# Patient Record
Sex: Male | Born: 1970 | Smoking: Never smoker
Health system: Southern US, Community
[De-identification: ages and names within clinical notes are randomized; demographics above are authoritative.]

---

## 2016-05-29 ENCOUNTER — Ambulatory Visit (INDEPENDENT_AMBULATORY_CARE_PROVIDER_SITE_OTHER): Payer: BLUE CROSS/BLUE SHIELD | Admitting: Emergency Medicine

## 2016-05-29 ENCOUNTER — Ambulatory Visit (INDEPENDENT_AMBULATORY_CARE_PROVIDER_SITE_OTHER): Payer: BLUE CROSS/BLUE SHIELD

## 2016-05-29 ENCOUNTER — Encounter: Payer: Self-pay | Admitting: Emergency Medicine

## 2016-05-29 VITALS — BP 162/82 | HR 67 | Temp 97.4°F

## 2016-05-29 DIAGNOSIS — R0789 Other chest pain: Secondary | ICD-10-CM | POA: Diagnosis not present

## 2016-05-29 DIAGNOSIS — D5701 Hb-SS disease with acute chest syndrome: Secondary | ICD-10-CM | POA: Diagnosis not present

## 2016-05-29 DIAGNOSIS — M25522 Pain in left elbow: Secondary | ICD-10-CM | POA: Diagnosis not present

## 2016-05-29 NOTE — Progress Notes (Signed)
Andrew Alexander 47 y.o.   Chief Complaint  Patient presents with  . Chest Pain    HISTORY OF PRESENT ILLNESS: This is a 46 y.o. male complaining of left elbow, left knee, chest pain since early this am. Interpreter used. No CAD risk factors; no associated symptoms.  Chest Pain   This is a new problem. The current episode started today. The onset quality is gradual. The problem occurs intermittently. The problem has been unchanged. Pain location: across chest both sides. The pain is at a severity of 3/10. The pain is mild. The quality of the pain is described as sharp. The pain does not radiate. Pertinent negatives include no abdominal pain, back pain, cough, diaphoresis, dizziness, exertional chest pressure, fever, hemoptysis, nausea, palpitations, shortness of breath, syncope or vomiting. The pain is aggravated by nothing. He has tried nothing for the symptoms. There are no known risk factors. Past medical history comments: Unremarkable     Prior to Admission medications   Not on File    Allergies not on file  Patient Active Problem List   Diagnosis Date Noted  . Left elbow pain 05/29/2016    History reviewed. No pertinent past medical history.  History reviewed. No pertinent surgical history.  Social History   Social History  . Marital status: Unknown    Spouse name: N/A  . Number of children: N/A  . Years of education: N/A   Occupational History  . Not on file.   Social History Main Topics  . Smoking status: Never Smoker  . Smokeless tobacco: Never Used  . Alcohol use No  . Drug use: No  . Sexual activity: Not on file   Other Topics Concern  . Not on file   Social History Narrative  . No narrative on file    History reviewed. No pertinent family history.   Review of Systems  Constitutional: Negative.  Negative for diaphoresis and fever.  HENT: Negative.  Negative for sore throat.   Eyes: Negative.   Respiratory: Negative.  Negative for cough, hemoptysis and  shortness of breath.   Cardiovascular: Positive for chest pain. Negative for palpitations, leg swelling and syncope.  Gastrointestinal: Negative.  Negative for abdominal pain, diarrhea, nausea and vomiting.  Genitourinary: Negative.   Musculoskeletal: Positive for joint pain (left elbow and left knee). Negative for back pain.  Skin: Negative.  Negative for rash.  Neurological: Negative for dizziness.  All other systems reviewed and are negative.  Vitals:   05/29/16 0822  BP: (!) 162/82  Pulse: 67  Temp: 97.4 F (36.3 C)    EKG: NSR with incomplete RBBB; no acute ischemic changes. Physical Exam  Constitutional: He is oriented to person, place, and time. He appears well-developed and well-nourished.  HENT:  Head: Normocephalic and atraumatic.  Mouth/Throat: Oropharynx is clear and moist. No oropharyngeal exudate.  Eyes: Conjunctivae and EOM are normal. Pupils are equal, round, and reactive to light.  Neck: Normal range of motion. Neck supple. No JVD present.  Cardiovascular: Normal rate, regular rhythm and intact distal pulses.   Pulmonary/Chest: Effort normal and breath sounds normal.  Abdominal: Soft. There is no tenderness.  Musculoskeletal: Normal range of motion.  LUE: elbow: mild tenderness lateral epicondyle; FROM, no swelling; NVI LLE: FROM, WNL; left knee: no swelling, erythema, bruising or tenderness.  Lymphadenopathy:    He has no cervical adenopathy.  Neurological: He is alert and oriented to person, place, and time.  Skin: Skin is warm and dry. Capillary refill takes less than  2 seconds.  Psychiatric: He has a normal mood and affect. His behavior is normal.  Vitals reviewed. CXR: NAD  ASSESSMENT & PLAN: Andrew Alexander was seen today for chest pain.  Diagnoses and all orders for this visit:  Atypical chest pain  Acute chest syndrome (HCC) -     EKG 12-Lead -     Comprehensive metabolic panel -     CBC with Differential/Platelet -     DG Chest 2 View; Future -      Ambulatory referral to Cardiology  Left elbow pain     Patient Instructions  Nonspecific Chest Pain Chest pain can be caused by many different conditions. There is a chance that your pain could be related to something serious, such as a heart attack or a blood clot in your lungs. Chest pain can also be caused by conditions that are not life-threatening. If you have chest pain, it is very important to follow up with your doctor. Follow these instructions at home: Medicines   If you were prescribed an antibiotic medicine, take it as told by your doctor. Do not stop taking the antibiotic even if you start to feel better.  Take over-the-counter and prescription medicines only as told by your doctor. Lifestyle   Do not use any products that contain nicotine or tobacco, such as cigarettes and e-cigarettes. If you need help quitting, ask your doctor.  Do not drink alcohol.  Make lifestyle changes as told by your doctor. These may include:  Getting regular exercise. Ask your doctor for some activities that are safe for you.  Eating a heart-healthy diet. A diet specialist (dietitian) can help you to learn healthy eating options.  Staying at a healthy weight.  Managing diabetes, if needed.  Lowering your stress, as with deep breathing or spending time in nature. General instructions   Avoid any activities that make you feel chest pain.  If your chest pain is because of heartburn:  Raise (elevate) the head of your bed about 6 inches (15 cm). You can do this by putting blocks under the bed legs at the head of the bed.  Do not sleep with extra pillows under your head. That does not help heartburn.  Keep all follow-up visits as told by your doctor. This is important. This includes any further testing if your chest pain does not go away. Contact a doctor if:  Your chest pain does not go away.  You have a rash with blisters on your chest.  You have a fever.  You have chills. Get  help right away if:  Your chest pain is worse.  You have a cough that gets worse, or you cough up blood.  You have very bad (severe) pain in your belly (abdomen).  You are very weak.  You pass out (faint).  You have either of these for no clear reason:  Sudden chest discomfort.  Sudden discomfort in your arms, back, neck, or jaw.  You have shortness of breath at any time.  You suddenly start to sweat, or your skin gets clammy.  You feel sick to your stomach (nauseous).  You throw up (vomit).  You suddenly feel light-headed or dizzy.  Your heart starts to beat fast, or it feels like it is skipping beats. These symptoms may be an emergency. Do not wait to see if the symptoms will go away. Get medical help right away. Call your local emergency services (911 in the [place] Do not drive yourself to the hospital. This  information is not intended to replace advice given to you by your health care provider. Make sure you discuss any questions you have with your health care provider. Document Released: 08/28/2007 Document Revised: 12/04/2015 Document Reviewed: 12/04/2015 Elsevier Interactive Patient Education  2017 [employer]       Edwina Barth, MD [facility]

## 2016-05-29 NOTE — Patient Instructions (Addendum)

## 2016-05-30 LAB — COMPREHENSIVE METABOLIC PANEL
ALT: 28 IU/L (ref 0–44)
AST: 26 IU/L (ref 0–40)
Albumin/Globulin Ratio: 1.5 (ref 1.2–2.2)
Albumin: 4.5 g/dL (ref 3.5–5.5)
Alkaline Phosphatase: 73 IU/L (ref 39–117)
BUN/Creatinine Ratio: 21 — ABNORMAL HIGH (ref 9–20)
BUN: 16 mg/dL (ref 6–24)
Bilirubin Total: 1 mg/dL (ref 0.0–1.2)
CALCIUM: 9.2 mg/dL (ref 8.7–10.2)
CO2: 29 mmol/L (ref 18–29)
CREATININE: 0.78 mg/dL (ref 0.76–1.27)
Chloride: 98 mmol/L (ref 96–106)
GFR calc Af Amer: 126 mL/min/{1.73_m2} (ref >59)
GFR, EST NON AFRICAN AMERICAN: 109 mL/min/{1.73_m2} (ref >59)
GLOBULIN, TOTAL: 3 g/dL (ref 1.5–4.5)
GLUCOSE: 97 mg/dL (ref 65–99)
Potassium: 4.9 mmol/L (ref 3.5–5.2)
SODIUM: 140 mmol/L (ref 134–144)
Total Protein: 7.5 g/dL (ref 6.0–8.5)

## 2016-05-30 LAB — CBC WITH DIFFERENTIAL/PLATELET
BASOS: 0 %
Basophils Absolute: 0 10*3/uL (ref 0.0–0.2)
EOS (ABSOLUTE): 0.2 10*3/uL (ref 0.0–0.4)
EOS: 3 %
HEMATOCRIT: 37.8 % (ref 37.5–51.0)
HEMOGLOBIN: 12.9 g/dL — AB (ref 13.0–17.7)
IMMATURE GRANS (ABS): 0 10*3/uL (ref 0.0–0.1)
IMMATURE GRANULOCYTES: 0 %
LYMPHS: 21 %
Lymphocytes Absolute: 1.7 10*3/uL (ref 0.7–3.1)
MCH: 21 pg — ABNORMAL LOW (ref 26.6–33.0)
MCHC: 34.1 g/dL (ref 31.5–35.7)
MCV: 62 fL — AB (ref 79–97)
MONOCYTES: 10 %
Monocytes Absolute: 0.9 10*3/uL (ref 0.1–0.9)
NEUTROS PCT: 66 %
Neutrophils Absolute: 5.4 10*3/uL (ref 1.4–7.0)
Platelets: 193 10*3/uL (ref 150–379)
RBC: 6.14 x10E6/uL — AB (ref 4.14–5.80)
RDW: 16.2 % — ABNORMAL HIGH (ref 12.3–15.4)
WBC: 8.1 10*3/uL (ref 3.4–10.8)

## 2017-11-19 IMAGING — DX DG CHEST 2V
2 series · 2 of 2 positions shown · non-contrast
Comparison: None.

CLINICAL DATA: Chest pain

EXAM:
CHEST  2 VIEW

[chest pa]
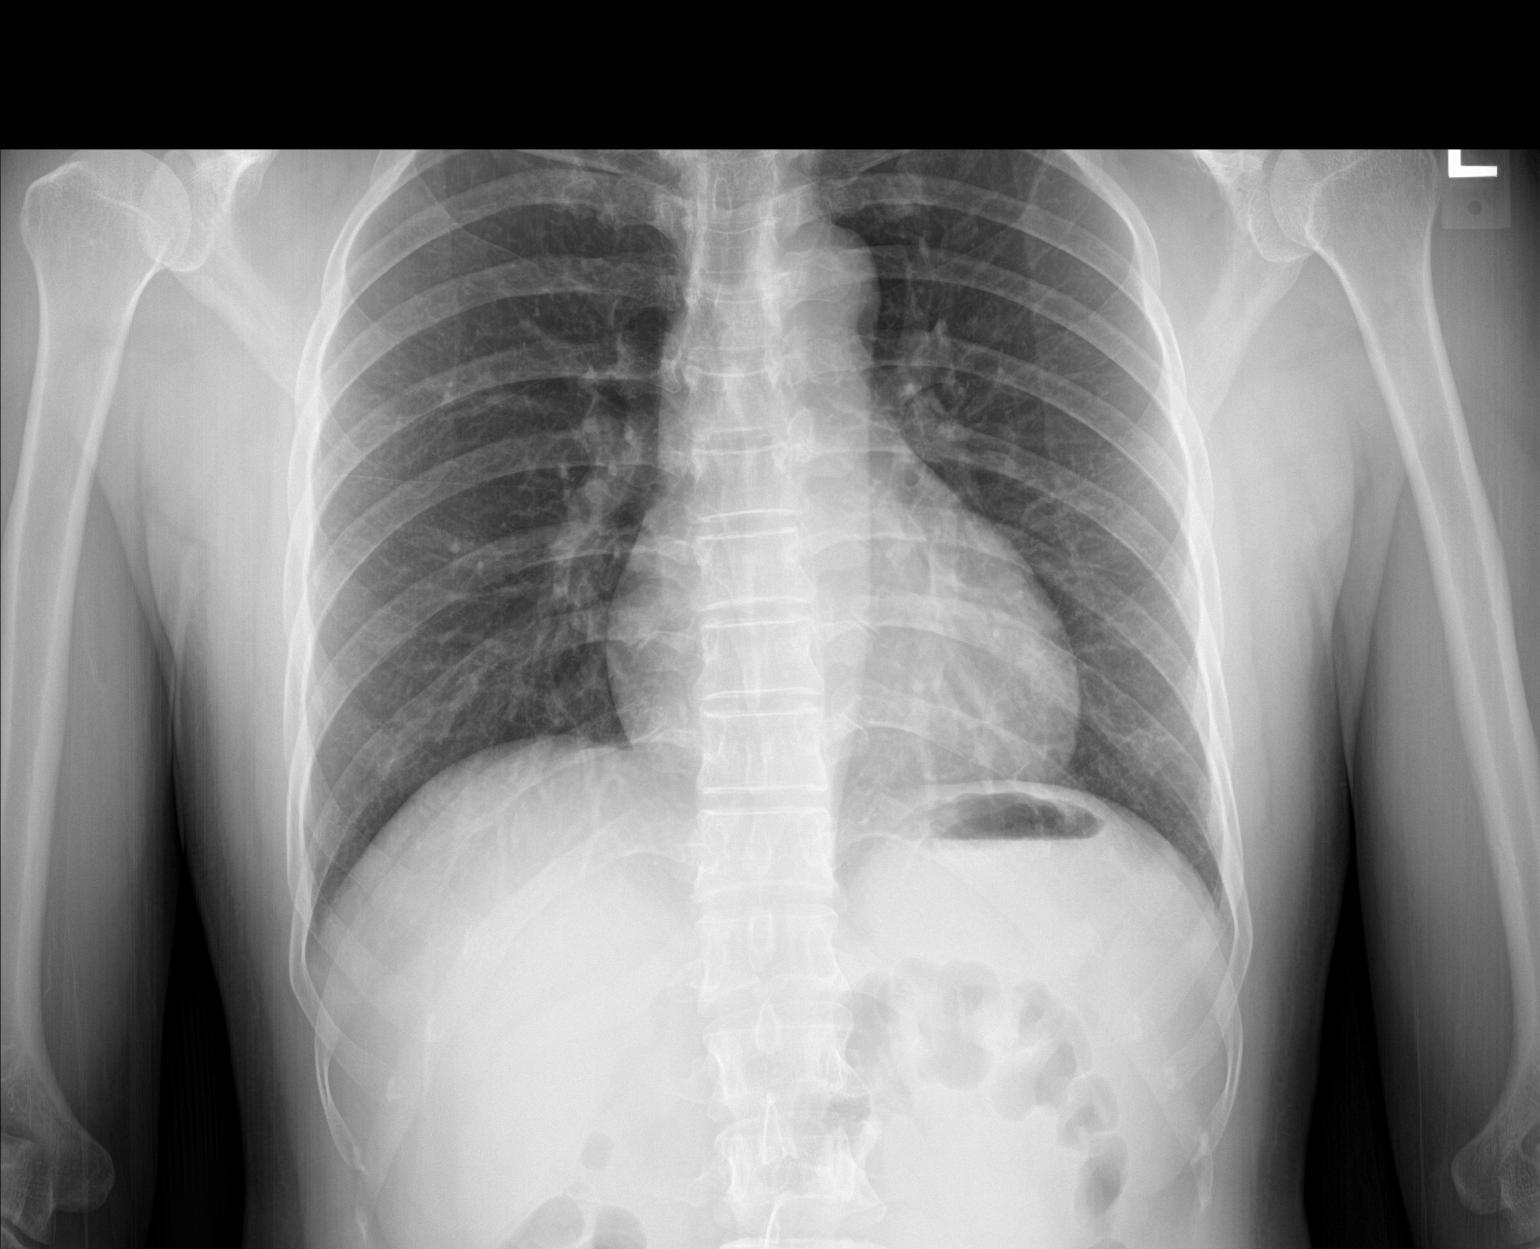

[chest lat]
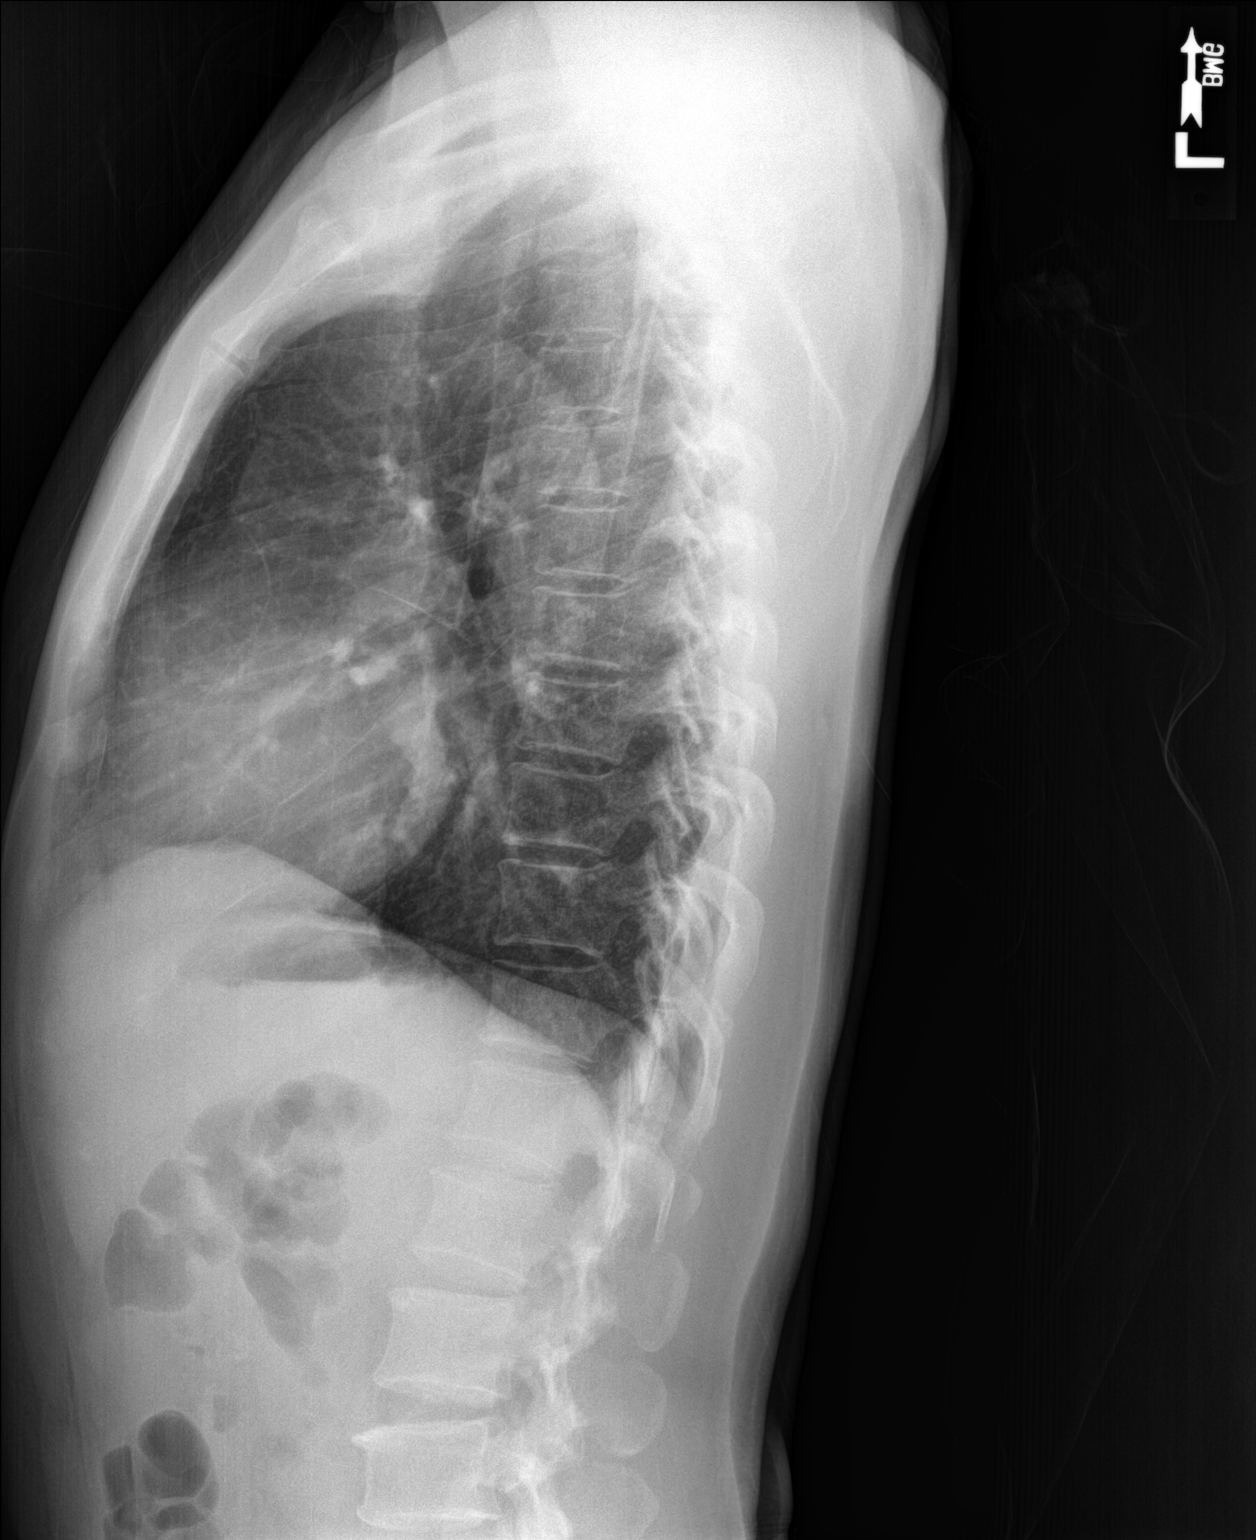

[2 of 2 positions shown; findings below may reference images not displayed]

FINDINGS: No active infiltrate or effusion is seen. Mediastinal and hilar
contours are unremarkable. The heart is within upper limits of
normal. No acute bony abnormality is seen.
IMPRESSION: No active cardiopulmonary disease.
